# Patient Record
Sex: Male | Born: 1995 | Race: White | Hispanic: No | Marital: Single | Smoking: Never smoker
Health system: Southern US, Community
[De-identification: ages and names within clinical notes are randomized; demographics above are authoritative.]

## PROBLEM LIST (undated history)

## (undated) DIAGNOSIS — K219 Gastro-esophageal reflux disease without esophagitis: Secondary | ICD-10-CM

## (undated) DIAGNOSIS — K200 Eosinophilic esophagitis: Secondary | ICD-10-CM

## (undated) DIAGNOSIS — T7840XA Allergy, unspecified, initial encounter: Secondary | ICD-10-CM

## (undated) DIAGNOSIS — K222 Esophageal obstruction: Secondary | ICD-10-CM

## (undated) DIAGNOSIS — IMO0001 Reserved for inherently not codable concepts without codable children: Secondary | ICD-10-CM

## (undated) DIAGNOSIS — I10 Essential (primary) hypertension: Secondary | ICD-10-CM

## (undated) HISTORY — DX: Esophageal obstruction: K22.2

## (undated) HISTORY — DX: Allergy, unspecified, initial encounter: T78.40XA

## (undated) HISTORY — PX: UPPER GASTROINTESTINAL ENDOSCOPY: SHX188

## (undated) HISTORY — DX: Gastro-esophageal reflux disease without esophagitis: K21.9

## (undated) HISTORY — DX: Essential (primary) hypertension: I10

## (undated) HISTORY — DX: Eosinophilic esophagitis: K20.0

---
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
## 2021-10-12 NOTE — Progress Notes (Signed)
Name: Larry Mccann  MRN/ DOB: [id number], 1995/12/23    Age/ Sex: 26 y.o., male    PCP: Hadley Pen, MD   Reason for Endocrinology Evaluation: HTN     Date of Initial Endocrinology Evaluation: 10/13/2021     HPI: Larry Mccann is a 26 y.o. male with a past medical history of HTn and eosinophilic esophagitis. The patient presented for initial endocrinology clinic visit on 10/13/2021 for consultative assistance with his HTN.    Larry Mccann is accompanied by his mother today  Pt has been following up with cardiology for uncontrolled blood pressure. He is currently on Losartan only.   He was noted with elevated BP since age 16. Officially diagnosed with hypertension last year  Per patient ,extensive cardiology work-up has been done which was unrevealing  He was sent to endocrinology for further evaluation of hormonal causes, given that his mother was diagnosed with renal cancer 13 years ago, at age 19  He was noted to have an undetectable aldosterone <1 ng/dL , normal TFT's and GFR  Weight is stable over the past year  Denies palpitations but has chest pains (evaluated by cardiology) Drinks 2 cups of coffee and rare sweet tea Does not eat licorice  He is on Vitamin C only No nasal sprays  No allergy medications Snacks on lightly salted peanuts Social ETOH  Denies episodes of lightheadedness or clammy sensation  Mother with hx of adrenal carcinoma at age 43. She is S/P left adrenalectomy ,  does not require any medications  Father with HTN as well as paternal grandfather     HISTORY:  Past Medical History:  Past Medical History:  Diagnosis Date   Allergy    Eosinophilic esophagitis    Esophageal stricture    GERD (gastroesophageal reflux disease)    Reflux    Past Surgical History:  Past Surgical History:  Procedure Laterality Date   FOREIGN BODY REMOVAL N/A 07/01/2013   Procedure: FOREIGN BODY REMOVAL;  Surgeon: Jon Gills, MD;  Location: [facility] END[facility]COPY;   Service: Gastroenterology;  Laterality: N/A;   UPPER GASTROINTESTINAL ENDOSCOPY      Social History:  reports that he has never smoked. He has never used smokeless tobacco. He reports that he does not drink alcohol and does not use drugs. Family History: family history is not on file.   HOME MEDICATIONS: Allergies as of 10/13/2021   No Known Allergies      Medication List        Accurate as of October 13, 2021  9:05 AM. If you have any questions, ask your nurse or doctor.          STOP taking these medications    pantoprazole 40 MG tablet Commonly known as: PROTONIX Stopped by: Larry Shorts, MD       TAKE these medications    losartan 50 MG tablet Commonly known as: COZAAR Take 50 mg by mouth daily.   omeprazole 20 MG capsule Commonly known as: PRILOSEC Take 20 mg by mouth daily.   valACYclovir 500 MG tablet Commonly known as: VALTREX Take by mouth.          REVIEW OF SYSTEMS: A comprehensive ROS was conducted with the patient and is negative except as per HPI    OBJECTIVE:  VS: BP 134/76 (BP Location: Left Arm, Patient Position: Sitting, Cuff Size: Large)   Pulse 84   Ht 5\' 8"  (1.727 m)   Wt 202 lb (91.6 kg)  SpO2 96%   BMI 30.71 kg/m    Wt Readings from Last 3 Encounters:  10/13/21 202 lb (91.6 kg)  02/24/14 178 lb 6 oz (80.9 kg) (83 %, Z= 0.97)*  12/30/13 178 lb (80.7 kg) (84 %, Z= 0.98)*   * Growth percentiles are based on CDC (Boys, 2-20 Years) data.     EXAM: General: Pt appears well and is in NAD  Eyes: External eye exam normal without stare, lid lag or exophthalmos.  EOM intact.  PERRL.  Neck: General: Supple without adenopathy. Thyroid: Thyroid size normal.  No goiter or nodules appreciated. No thyroid bruit.  Lungs: Clear with good BS bilat with no rales, rhonchi, or wheezes  Heart: Auscultation: RRR.  Abdomen: Normoactive bowel sounds, soft, nontender, without masses or organomegaly palpable  Extremities:  BL LE:  No pretibial edema normal ROM and strength.  Mental Status: Judgment, insight: Intact Orientation: Oriented to time, place, and person Mood and affect: No depression, anxiety, or agitation     DATA REVIEWED:     Latest Reference Range & Units 10/13/21 08:19  Sodium 135 - 145 mEq/L 140  Potassium 3.5 - 5.1 mEq/L 4.6  Chloride 96 - 112 mEq/L 103  CO2 19 - 32 mEq/L 29  Glucose 70 - 99 mg/dL 88  BUN 6 - 23 mg/dL 14  Creatinine 4.17 - 4.08 mg/dL 1.44  Calcium 8.4 - 81.8 mg/dL 56.3  GFR >14.97 mL/min 91.90  TSH 0.35 - 5.50 uIU/mL 1.56  Triiodothyronine,Free,Serum 2.3 - 4.2 pg/mL 3.6  T4,Free(Direct) 0.60 - 1.60 ng/dL 0.26    ASSESSMENT/PLAN/RECOMMENDATIONS:   HTN:  -BP today 134/76 mmhg on losartan only -I have counseled the patient about reducing salt intake as well as caffeine intake is much as possible -Given maternal history of adrenal carcinoma, he was sent here for further evaluation of any adrenal causes of hypertension  -I will proceed with renin, aldosterone, and 24-hour urinary cortisol and catecholamine check up    Follow-up pending lab results  Signed electronically by: Lyndle Herrlich, MD  [facility]  [facility] [address] [place] [place], [place] [address] Phone: [phone] FAX: [phone]   CC: Hadley Pen, MD [address] [place] [address] Phone: [phone] Fax: [phone]   Return to Endocrinology clinic as below: No future appointments.

## 2021-10-13 ENCOUNTER — Encounter: Payer: Self-pay | Admitting: Internal Medicine

## 2021-10-13 ENCOUNTER — Ambulatory Visit (INDEPENDENT_AMBULATORY_CARE_PROVIDER_SITE_OTHER): Payer: 59 | Admitting: Internal Medicine

## 2021-10-13 VITALS — BP 134/76 | HR 84 | Ht 68.0 in | Wt 202.0 lb

## 2021-10-13 DIAGNOSIS — I10 Essential (primary) hypertension: Secondary | ICD-10-CM

## 2021-10-13 LAB — T4, FREE: Free T4: 0.87 ng/dL (ref 0.60–1.60)

## 2021-10-13 LAB — BASIC METABOLIC PANEL
BUN: 14 mg/dL (ref 6–23)
CO2: 29 mEq/L (ref 19–32)
Calcium: 10 mg/dL (ref 8.4–10.5)
Chloride: 103 mEq/L (ref 96–112)
Creatinine, Ser: 1.11 mg/dL (ref 0.40–1.50)
GFR: 91.9 mL/min (ref >60.00)
Glucose, Bld: 88 mg/dL (ref 70–99)
Potassium: 4.6 mEq/L (ref 3.5–5.1)
Sodium: 140 mEq/L (ref 135–145)

## 2021-10-13 LAB — T3, FREE: T3, Free: 3.6 pg/mL (ref 2.3–4.2)

## 2021-10-13 LAB — TSH: TSH: 1.56 u[IU]/mL (ref 0.35–5.50)

## 2021-10-13 NOTE — Patient Instructions (Signed)

## 2021-10-20 LAB — ALDOSTERONE + RENIN ACTIVITY W/ RATIO
ALDOS/RENIN RATIO: 2.9 (ref 0.0–30.0)
ALDOSTERONE: 3.7 ng/dL (ref 0.0–30.0)
Renin: 1.291 ng/mL/hr (ref 0.167–5.380)

## 2021-11-17 ENCOUNTER — Encounter (HOSPITAL_COMMUNITY): Payer: Self-pay | Admitting: Emergency Medicine

## 2021-11-17 ENCOUNTER — Ambulatory Visit (HOSPITAL_COMMUNITY)
Admission: EM | Admit: 2021-11-17 | Discharge: 2021-11-17 | Disposition: A | Discharge status: Home / Self Care | Payer: 59 | Attending: Sports Medicine | Admitting: Sports Medicine

## 2021-11-17 DIAGNOSIS — R21 Rash and other nonspecific skin eruption: Secondary | ICD-10-CM | POA: Diagnosis not present

## 2021-11-17 MED ORDER — CEPHALEXIN 500 MG PO CAPS: 500.0000 mg | ORAL_CAPSULE | Freq: Four times a day (QID) | ORAL | 0 refills | Status: AC

## 2021-11-17 MED ORDER — METHYLPREDNISOLONE 4 MG PO TBPK: ORAL_TABLET | ORAL | 0 refills | Status: AC

## 2021-11-17 NOTE — ED Provider Notes (Signed)
[place]   [id number] 11/17/21 Arrival Time: [place] PLAN:  1. Rash and nonspecific skin eruption    -The etiology of his symptoms is difficult to ascertain.  The macules are superficial in nature and do not seem to be related to any vasculitis or bacterial fungal infection.  I suspect he likely had some microtrauma while walking barefoot in [place] that led to some irritation.  Will prescribe prednisone to decrease inflammation and make it more bearable to walk.  Recommended he cover the lesions with Band-Aids or bandages for extra padding.  I will empirically prescribe Keflex for him to fill and travel with while he goes to [place] in case these macules spread, worsen, become more erythematous or purulent.  Recommend if this does not get better he sees a dermatologist or come here for reevaluation.  All questions were answered and agrees to plan.   Meds ordered this encounter  Medications   methylPREDNISolone (MEDROL DOSEPAK) 4 MG TBPK tablet    Sig: Take as directed on label    Dispense:  21 tablet    Refill:  0   cephALEXin (KEFLEX) 500 MG capsule    Sig: Take 1 capsule (500 mg total) by mouth 4 (four) times daily.    Dispense:  20 capsule    Refill:  0     Discharge Instructions      I am unsure the cause of your skin findings I do not see any concerning signs of skin or blood infection You likely had some small repetitive traumas to your feet from the ground in [place] Keep the areas covered with bandage Try the medrol dose pak to decrease inflammation Hold on to the antibiotic and only take the antibiotics if the redness worsens or spreads. It was nice to meet you today, congratulations on your marriage!      Follow-up Information     Myrlene Broker, MD.   Specialty: Family Medicine Why: If symptoms worsen Contact information: [address] [place] [address] [phone]                  Reviewed expectations  re: course of current medical issues. Questions answered. Outlined signs and symptoms indicating need for more acute intervention. Patient verbalized understanding. After Visit Summary given.   SUBJECTIVE: Pleasant 26 year old male comes urgent care for evaluation of skin lesions on his feet.  He and his wife returned from their honeymoon in [place] on Saturday.  On Sunday he noticed several painful lesions on the plantar surface of his bilateral feet.  These were not present before the trip.  He denies any known trauma to the feet while on his trip, however he says that he walked around a lot barefoot on the beach.  These lesions of now made it painful for him to walk.  He denies any systemic symptoms such as fever, chills, other skin lesions anywhere else on his body.  His wife does not have any lesions.  The patient is leaving to go to [place] this weekend and wanted to be evaluated before he leaves to go out of the country again.  No LMP for male patient. Past Surgical History:  Procedure Laterality Date   FOREIGN BODY REMOVAL N/A 07/01/2013   Procedure: FOREIGN BODY REMOVAL;  Surgeon: Oletha Blend, MD;  Location: [facility];  Service: Gastroenterology;  Laterality: N/A;   UPPER GASTROINTESTINAL ENDOSCOPY       OBJECTIVE:  Vitals:  11/17/21 1224  BP: (!) 150/98  Pulse: 99  Resp: 17  Temp: 98.4 F (36.9 C)  TempSrc: Oral  SpO2: 100%     Physical Exam Vitals reviewed.  Constitutional:      Appearance: Normal appearance. He is not ill-appearing or toxic-appearing.  Cardiovascular:     Rate and Rhythm: Normal rate.  Pulmonary:     Effort: Pulmonary effort is normal.  Musculoskeletal:        General: Normal range of motion.  Skin:    Comments: R Foot -there are 3 small oval-shaped red macules on the plantar surface of the foot, 1 near the heel, 1 at the medial midfoot, 1 at the forefoot.  They are tender to palpation.  There is no surrounding erythema or drainage.  He also  has 1/2 cm blister with macerated skin on the lateral aspect of the great toe where it rubs against the second digit.  L Foot -there are 3 small oval-shaped red macules on the plantar surface of foot, one near the heel, one in the foot forefoot, 1 at the base of the fifth phalanx.  These are tender to palpation.  The fifth digit is mildly erythematous and tender to the touch.      Labs: Results for orders placed or performed in visit on 10/13/21  T3, free  Result Value Ref Range   T3, Free 3.6 2.3 - 4.2 pg/mL  Aldosterone + renin activity w/ ratio  Result Value Ref Range   ALDOSTERONE 3.7 0.0 - 30.0 ng/dL   Renin 1.291 0.167 - 5.380 ng/mL/hr   ALDOS/RENIN RATIO 2.9 0.0 - [id number]  Basic metabolic panel  Result Value Ref Range   Sodium 140 135 - 145 mEq/L   Potassium 4.6 3.5 - 5.1 mEq/L   Chloride 103 96 - 112 mEq/L   CO2 29 19 - 32 mEq/L   Glucose, Bld 88 70 - 99 mg/dL   BUN 14 6 - 23 mg/dL   Creatinine, Ser 1.11 0.40 - 1.50 mg/dL   GFR 91.90 >60.00 mL/min   Calcium 10.0 8.4 - 10.5 mg/dL  T4, free  Result Value Ref Range   Free T4 0.87 0.60 - 1.60 ng/dL  TSH  Result Value Ref Range   TSH 1.56 0.35 - 5.50 uIU/mL   Labs Reviewed - No data to display  Imaging: No results found.   No Known Allergies                                             Past Medical History:  Diagnosis Date   Allergy    Eosinophilic esophagitis    Esophageal stricture    GERD (gastroesophageal reflux disease)    Hypertension    Reflux     Social History   Socioeconomic History   Marital status: Single    Spouse name: Not on file   Number of children: 0   Years of education: Not on file   Highest education level: Not on file  Occupational History   Occupation: Student  Tobacco Use   Smoking status: Never   Smokeless tobacco: Never  Substance and Sexual Activity   Alcohol use: No   Drug use: No   Sexual activity: Not on file  Other Topics Concern   Not on file  Social History  Narrative   Not on file   Social Determinants of  Health   Financial Resource Strain: Not on file  Food Insecurity: Not on file  Transportation Needs: Not on file  Physical Activity: Not on file  Stress: Not on file  Social Connections: Not on file  Intimate Partner Violence: Not on file    Family History  Problem Relation Age of Onset   Colon cancer Neg Hx    Esophageal cancer Neg Hx    Rectal cancer Neg Hx    Stomach cancer Neg Hx       Kaija Kovacevic, Dorian Pod, MD 11/17/21 1347

## 2021-11-17 NOTE — Discharge Instructions (Addendum)
I am unsure the cause of your skin findings I do not see any concerning signs of skin or blood infection You likely had some small repetitive traumas to your feet from the ground in [place] Keep the areas covered with bandage Try the medrol dose pak to decrease inflammation Hold on to the antibiotic and only take the antibiotics if the redness worsens or spreads. It was nice to meet you today, congratulations on your marriage!

## 2021-11-17 NOTE — ED Triage Notes (Signed)
Pt reports was in [place] last week on honeymoon. Reports since Sunday noticed rash on bilat feet and painful esp with weight bearing.
